# Patient Record
Sex: Female | Born: 1935 | Race: White | Hispanic: No | Marital: Married
Health system: Southern US, Community
[De-identification: ages and names within clinical notes are randomized; demographics above are authoritative.]

---

## 2004-10-14 ENCOUNTER — Emergency Department: Payer: Self-pay | Admitting: Emergency Medicine

## 2004-11-09 ENCOUNTER — Ambulatory Visit: Payer: Self-pay | Admitting: Internal Medicine

## 2004-11-16 ENCOUNTER — Ambulatory Visit: Payer: Self-pay | Admitting: Internal Medicine

## 2004-11-24 ENCOUNTER — Ambulatory Visit: Payer: Self-pay

## 2005-01-09 ENCOUNTER — Ambulatory Visit: Payer: Self-pay | Admitting: Gastroenterology

## 2005-12-12 ENCOUNTER — Other Ambulatory Visit: Payer: Self-pay

## 2005-12-12 ENCOUNTER — Inpatient Hospital Stay: Payer: Self-pay | Admitting: Internal Medicine

## 2006-02-26 ENCOUNTER — Ambulatory Visit: Payer: Self-pay | Admitting: Internal Medicine

## 2007-01-12 ENCOUNTER — Other Ambulatory Visit: Payer: Self-pay

## 2007-01-12 ENCOUNTER — Observation Stay: Payer: Self-pay | Admitting: Internal Medicine

## 2007-04-17 ENCOUNTER — Ambulatory Visit: Payer: Self-pay | Admitting: Internal Medicine

## 2008-04-20 ENCOUNTER — Ambulatory Visit: Payer: Self-pay | Admitting: Internal Medicine

## 2009-04-22 ENCOUNTER — Ambulatory Visit: Payer: Self-pay | Admitting: Internal Medicine

## 2009-07-23 ENCOUNTER — Ambulatory Visit: Payer: Self-pay | Admitting: Internal Medicine

## 2009-08-16 ENCOUNTER — Inpatient Hospital Stay: Payer: Self-pay | Admitting: Internal Medicine

## 2009-08-23 ENCOUNTER — Ambulatory Visit: Payer: Self-pay | Admitting: Internal Medicine

## 2010-03-27 ENCOUNTER — Inpatient Hospital Stay: Payer: Self-pay | Admitting: Internal Medicine

## 2010-04-24 ENCOUNTER — Ambulatory Visit: Payer: Self-pay | Admitting: Internal Medicine

## 2011-04-13 ENCOUNTER — Emergency Department: Payer: Self-pay | Admitting: Emergency Medicine

## 2011-06-03 ENCOUNTER — Inpatient Hospital Stay: Payer: Self-pay | Admitting: Internal Medicine

## 2011-07-20 ENCOUNTER — Ambulatory Visit: Payer: Self-pay | Admitting: Internal Medicine

## 2011-08-14 ENCOUNTER — Encounter: Payer: Self-pay | Admitting: Nurse Practitioner

## 2011-08-14 ENCOUNTER — Encounter: Payer: Self-pay | Admitting: Cardiothoracic Surgery

## 2011-08-24 ENCOUNTER — Encounter: Payer: Self-pay | Admitting: Cardiothoracic Surgery

## 2011-08-24 ENCOUNTER — Encounter: Payer: Self-pay | Admitting: Nurse Practitioner

## 2011-09-21 ENCOUNTER — Encounter: Payer: Self-pay | Admitting: Cardiothoracic Surgery

## 2011-09-21 ENCOUNTER — Encounter: Payer: Self-pay | Admitting: Nurse Practitioner

## 2011-11-01 ENCOUNTER — Encounter: Payer: Self-pay | Admitting: Cardiothoracic Surgery

## 2011-11-01 ENCOUNTER — Encounter: Payer: Self-pay | Admitting: Nurse Practitioner

## 2011-11-21 ENCOUNTER — Encounter: Payer: Self-pay | Admitting: Nurse Practitioner

## 2011-11-21 ENCOUNTER — Encounter: Payer: Self-pay | Admitting: Cardiothoracic Surgery

## 2011-12-20 ENCOUNTER — Emergency Department: Payer: Self-pay | Admitting: Emergency Medicine

## 2011-12-20 LAB — CBC
HCT: 31.5 % — ABNORMAL LOW (ref 35.0–47.0)
MCH: 31.4 pg (ref 26.0–34.0)
MCHC: 33.9 g/dL (ref 32.0–36.0)
MCV: 93 fL (ref 80–100)
Platelet: 239 10*3/uL (ref 150–440)
RBC: 3.4 10*6/uL — ABNORMAL LOW (ref 3.80–5.20)
WBC: 8.1 10*3/uL (ref 3.6–11.0)

## 2011-12-20 LAB — COMPREHENSIVE METABOLIC PANEL
Albumin: 2.9 g/dL — ABNORMAL LOW (ref 3.4–5.0)
Alkaline Phosphatase: 62 U/L (ref 50–136)
Anion Gap: 7 (ref 7–16)
BUN: 15 mg/dL (ref 7–18)
Calcium, Total: 8.7 mg/dL (ref 8.5–10.1)
Chloride: 97 mmol/L — ABNORMAL LOW (ref 98–107)
Co2: 31 mmol/L (ref 21–32)
Creatinine: 1.01 mg/dL (ref 0.60–1.30)
EGFR (African American): 58 — ABNORMAL LOW
Glucose: 91 mg/dL (ref 65–99)
SGOT(AST): 18 U/L (ref 15–37)
SGPT (ALT): 19 U/L
Sodium: 135 mmol/L — ABNORMAL LOW (ref 136–145)

## 2011-12-20 LAB — URINALYSIS, COMPLETE
Bilirubin,UR: NEGATIVE
Blood: NEGATIVE
Glucose,UR: NEGATIVE mg/dL (ref 0–75)
Leukocyte Esterase: NEGATIVE
Nitrite: NEGATIVE
Ph: 8 (ref 4.5–8.0)
Protein: NEGATIVE
RBC,UR: <1 /HPF (ref 0–5)
Specific Gravity: 1.002 (ref 1.003–1.030)
Squamous Epithelial: NONE SEEN
WBC UR: 1 /HPF (ref 0–5)

## 2011-12-20 LAB — CK TOTAL AND CKMB (NOT AT ARMC)
CK, Total: 25 U/L (ref 21–215)
CK-MB: 1.1 ng/mL (ref 0.5–3.6)

## 2011-12-20 LAB — TROPONIN I: Troponin-I: 0.02 ng/mL

## 2011-12-22 ENCOUNTER — Encounter: Payer: Self-pay | Admitting: Nurse Practitioner

## 2011-12-22 ENCOUNTER — Encounter: Payer: Self-pay | Admitting: Cardiothoracic Surgery

## 2012-01-21 ENCOUNTER — Encounter: Payer: Self-pay | Admitting: Nurse Practitioner

## 2012-01-21 ENCOUNTER — Encounter: Payer: Self-pay | Admitting: Cardiothoracic Surgery

## 2012-02-21 ENCOUNTER — Encounter: Payer: Self-pay | Admitting: Nurse Practitioner

## 2012-02-21 ENCOUNTER — Encounter: Payer: Self-pay | Admitting: Cardiothoracic Surgery

## 2012-03-07 ENCOUNTER — Ambulatory Visit: Payer: Self-pay

## 2012-08-17 ENCOUNTER — Inpatient Hospital Stay: Payer: Self-pay

## 2012-08-17 LAB — COMPREHENSIVE METABOLIC PANEL
Albumin: 3 g/dL — ABNORMAL LOW (ref 3.4–5.0)
Alkaline Phosphatase: 77 U/L (ref 50–136)
Anion Gap: 4 — ABNORMAL LOW (ref 7–16)
Calcium, Total: 8.9 mg/dL (ref 8.5–10.1)
Co2: 35 mmol/L — ABNORMAL HIGH (ref 21–32)
Creatinine: 1.13 mg/dL (ref 0.60–1.30)
EGFR (African American): 50 — ABNORMAL LOW
EGFR (Non-African Amer.): 43 — ABNORMAL LOW
Glucose: 111 mg/dL — ABNORMAL HIGH (ref 65–99)
SGPT (ALT): 26 U/L (ref 12–78)
Total Protein: 6.1 g/dL — ABNORMAL LOW (ref 6.4–8.2)

## 2012-08-17 LAB — CK TOTAL AND CKMB (NOT AT ARMC)
CK, Total: 30 U/L (ref 21–215)
CK-MB: 1.7 ng/mL (ref 0.5–3.6)

## 2012-08-17 LAB — CBC WITH DIFFERENTIAL/PLATELET
Basophil #: 0 10*3/uL (ref 0.0–0.1)
Basophil %: 0.4 %
Eosinophil %: 0.3 %
HGB: 10.8 g/dL — ABNORMAL LOW (ref 12.0–16.0)
Lymphocyte #: 1.3 10*3/uL (ref 1.0–3.6)
MCH: 30.6 pg (ref 26.0–34.0)
MCV: 92 fL (ref 80–100)
Monocyte %: 6.5 %
Neutrophil #: 7.6 10*3/uL — ABNORMAL HIGH (ref 1.4–6.5)
Neutrophil %: 79.6 %
Platelet: 249 10*3/uL (ref 150–440)
RDW: 12.9 % (ref 11.5–14.5)
WBC: 9.6 10*3/uL (ref 3.6–11.0)

## 2012-08-17 LAB — PROTIME-INR
INR: 0.9
Prothrombin Time: 12.5 secs (ref 11.5–14.7)

## 2012-08-17 LAB — TROPONIN I: Troponin-I: 0.1 ng/mL — ABNORMAL HIGH

## 2012-08-17 LAB — TSH: Thyroid Stimulating Horm: 1.18 u[IU]/mL

## 2012-08-17 LAB — MAGNESIUM: Magnesium: 2 mg/dL

## 2012-08-18 LAB — CK TOTAL AND CKMB (NOT AT ARMC)
CK, Total: 32 U/L (ref 21–215)
CK-MB: 2 ng/mL (ref 0.5–3.6)

## 2012-08-18 LAB — CBC WITH DIFFERENTIAL/PLATELET
Basophil #: 0.1 10*3/uL (ref 0.0–0.1)
Basophil %: 0.6 %
Eosinophil %: 0.3 %
HCT: 29.1 % — ABNORMAL LOW (ref 35.0–47.0)
HGB: 9.4 g/dL — ABNORMAL LOW (ref 12.0–16.0)
Lymphocyte #: 2.8 10*3/uL (ref 1.0–3.6)
Lymphocyte %: 18.9 %
MCH: 29.6 pg (ref 26.0–34.0)
MCV: 92 fL (ref 80–100)
Monocyte #: 1 x10 3/mm — ABNORMAL HIGH (ref 0.2–0.9)
Monocyte %: 6.8 %
Neutrophil #: 10.7 10*3/uL — ABNORMAL HIGH (ref 1.4–6.5)

## 2012-08-18 LAB — BASIC METABOLIC PANEL
Anion Gap: 5 — ABNORMAL LOW (ref 7–16)
BUN: 18 mg/dL (ref 7–18)
Co2: 34 mmol/L — ABNORMAL HIGH (ref 21–32)
Creatinine: 1.06 mg/dL (ref 0.60–1.30)
EGFR (Non-African Amer.): 47 — ABNORMAL LOW
Osmolality: 273 (ref 275–301)
Potassium: 5 mmol/L (ref 3.5–5.1)
Sodium: 135 mmol/L — ABNORMAL LOW (ref 136–145)

## 2012-08-18 LAB — TROPONIN I: Troponin-I: 0.09 ng/mL — ABNORMAL HIGH

## 2012-08-19 LAB — CBC WITH DIFFERENTIAL/PLATELET
Basophil #: 0 10*3/uL (ref 0.0–0.1)
Basophil %: 0.4 %
Eosinophil %: 1.3 %
Lymphocyte %: 25.9 %
MCH: 30.1 pg (ref 26.0–34.0)
MCHC: 32.3 g/dL (ref 32.0–36.0)
Monocyte #: 0.9 x10 3/mm (ref 0.2–0.9)
Neutrophil %: 60.5 %
RDW: 12.8 % (ref 11.5–14.5)

## 2012-08-20 LAB — CBC WITH DIFFERENTIAL/PLATELET
Basophil #: 0 10*3/uL (ref 0.0–0.1)
Basophil %: 0.4 %
Eosinophil #: 0.1 10*3/uL (ref 0.0–0.7)
HCT: 22.6 % — ABNORMAL LOW (ref 35.0–47.0)
HGB: 7.4 g/dL — ABNORMAL LOW (ref 12.0–16.0)
Lymphocyte %: 25.8 %
MCH: 30.1 pg (ref 26.0–34.0)
MCHC: 32.9 g/dL (ref 32.0–36.0)
Monocyte #: 1.1 x10 3/mm — ABNORMAL HIGH (ref 0.2–0.9)
Neutrophil %: 59.6 %
Platelet: 185 10*3/uL (ref 150–440)
RDW: 12.9 % (ref 11.5–14.5)
WBC: 8.4 10*3/uL (ref 3.6–11.0)

## 2012-09-03 ENCOUNTER — Ambulatory Visit: Payer: Self-pay | Admitting: Vascular Surgery

## 2012-11-20 DEATH — deceased

## 2014-03-31 IMAGING — CR DG CHEST 1V
1 series · 1 of 1 positions shown · non-contrast
Comparison: none

REASON FOR EXAM: fall injury
COMMENTS:

[x chest ap]
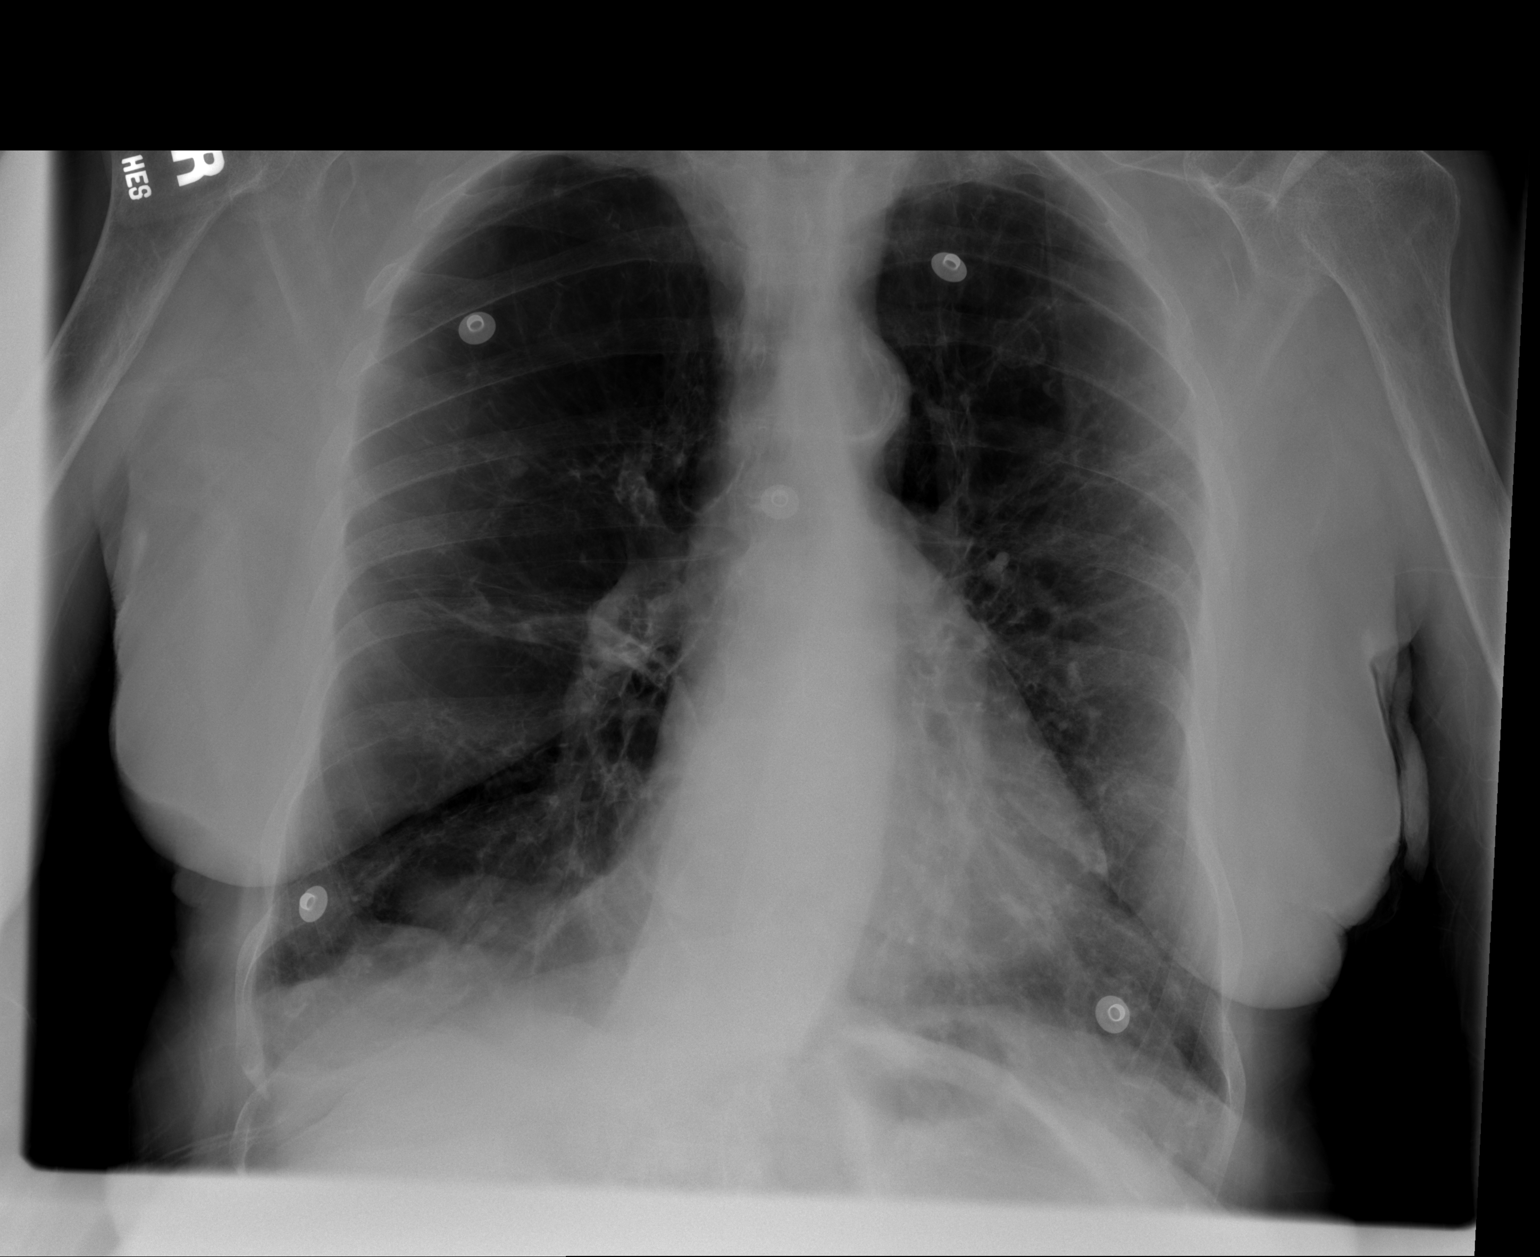

[1 of 1 positions shown; findings below may reference images not displayed]

PROCEDURE:     DXR - DXR CHEST 1 VIEWAP OR PA  - August 17, 2012  [DATE]

RESULT:     Comparison is made to the previous study 20 December, 2011. The
lungs are hyperinflated consistent with COPD. The cardiac silhouette is at
the upper limits of normal. There are extensive bands of density in the
lungs suggestive of fibrosis or atelectasis. No consolidation, mass,
effusion or interstitial edema is evident. Atherosclerotic calcification is
present within the aortic arch. No effusion is demonstrated.
IMPRESSION: COPD with fibrosis. No acute abnormality or significant
change.

[REDACTED]

## 2014-03-31 IMAGING — CR RIGHT TIBIA AND FIBULA - 2 VIEW
1 series · 2 of 2 positions shown · non-contrast
Comparison: none

REASON FOR EXAM: fall / hematoma
COMMENTS:

RESULT:     Images of the right tibia and fibula show no definite fracture,
dislocation or radiopaque foreign body. There is significant atherosclerotic
calcification present. There is diffuse osteopenia.

[Series 1: x tib-fib ap right · 0.14mm/px · 2 of 2 slices shown]
[im 1/2]
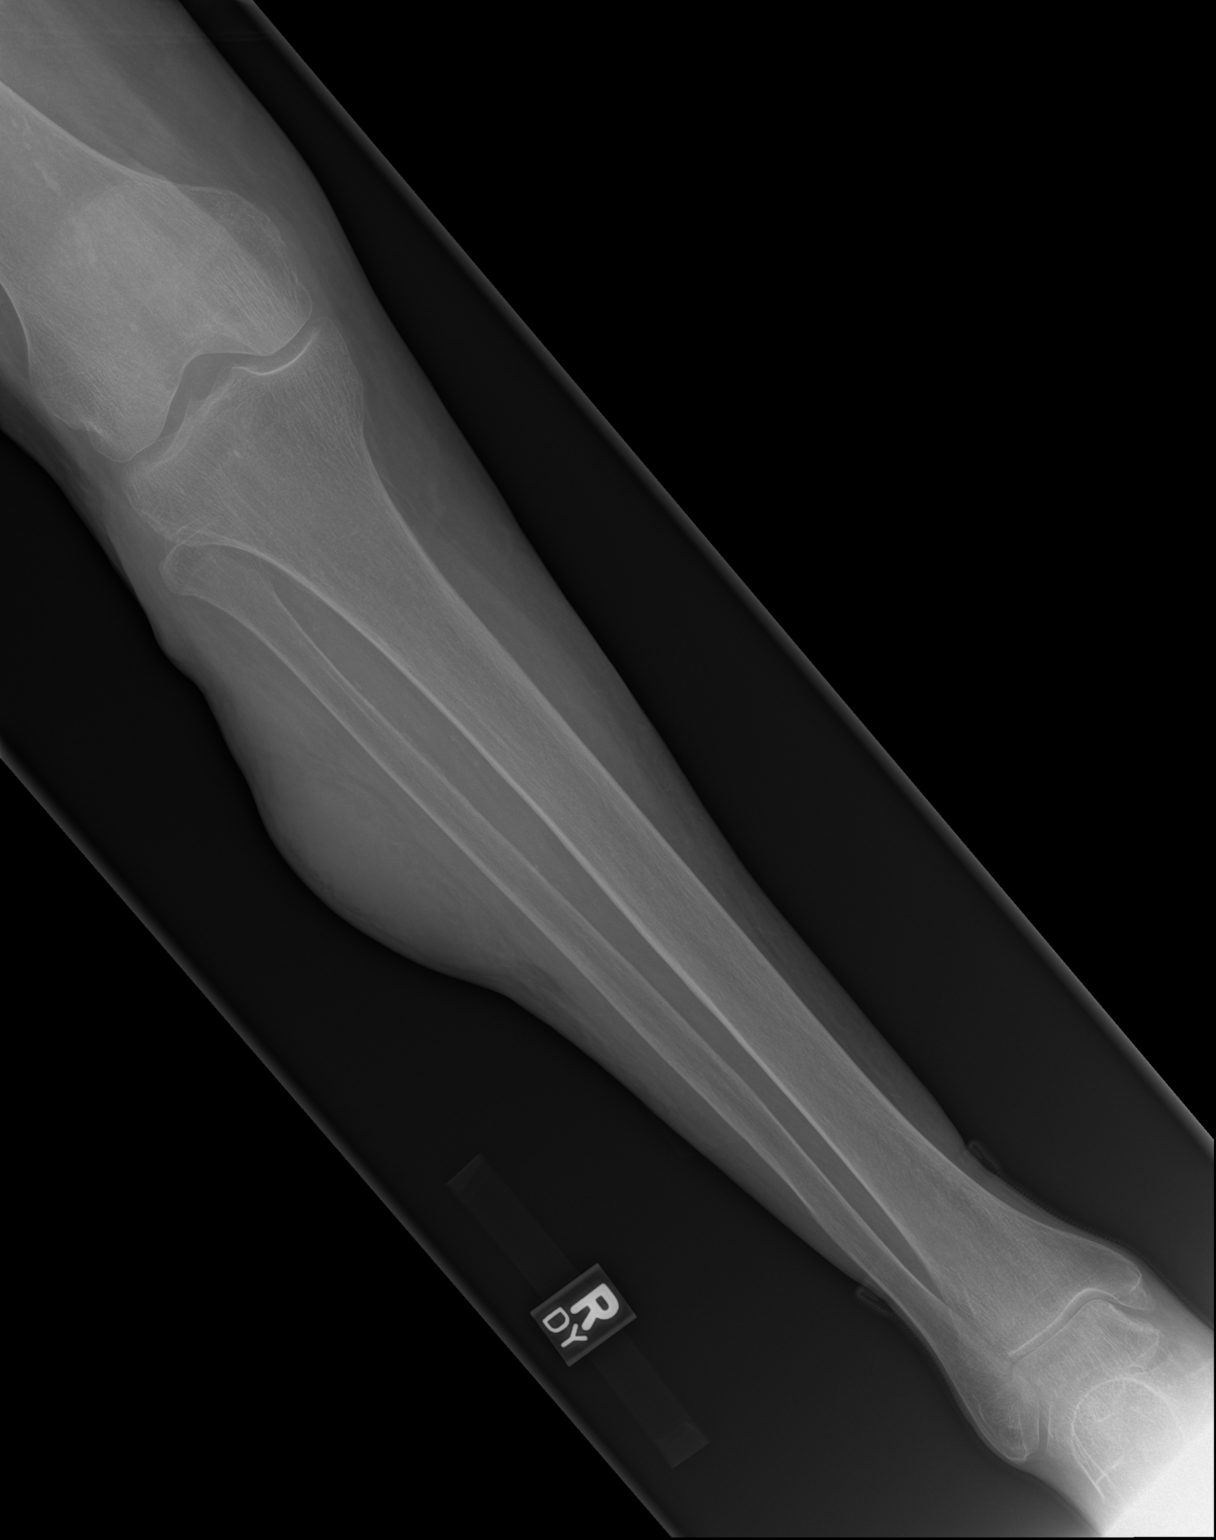
[im 2/2]
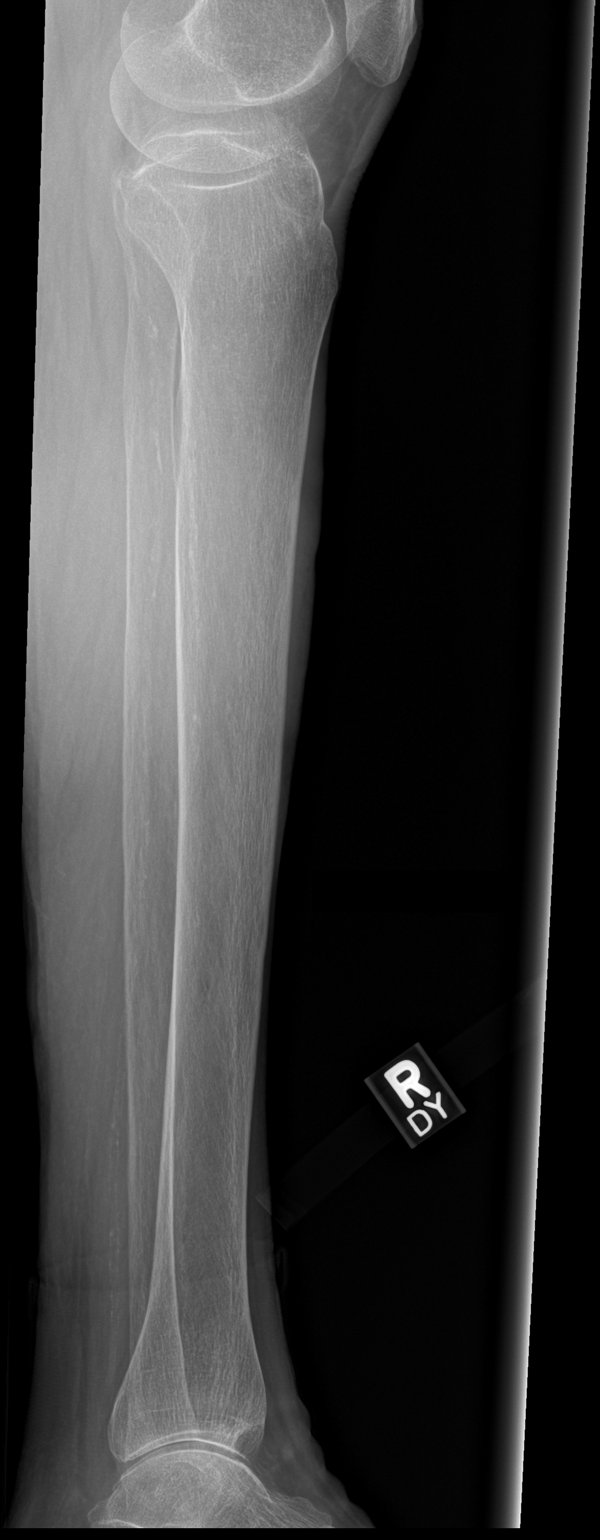

[2 of 2 positions shown; findings below may reference images not displayed]

IMPRESSION: 1. No acute bony abnormality evident.

[REDACTED]

## 2014-11-12 NOTE — Op Note (Signed)
ADDNEDUM  PATIENT NAME:  Mee HivesROBERTSON, Kaelynn M MR#:  [id number] DATE OF BIRTH:  1921-08-02  DATE OF PROCEDURE:  09/03/2012  SPECIMEN: Debrided tissue and contents of hematoma noted. This specimen was described as correct, but it was not sent for pathology.   ____________________________ Renford DillsGregory G. Schnier, MD ggs:aw D: 09/24/2012 13:50:51 ET T: 09/24/2012 14:01:51 ET JOB#: [id number]  cc: Renford DillsGregory G. Schnier, MD, <Dictator> Renford DillsGREGORY G SCHNIER MD ELECTRONICALLY SIGNED 10/21/2012 17:14

## 2014-11-12 NOTE — Consult Note (Signed)
General Aspect 81 year old female with history of CAD PVD hypertension hyperlipidemia and chronic lung disease requiring oxygen that stumbled and fell during the night with significant trauma, hematoma to right lower leg due to history of being on Plavix.Patient also reports she's had intermittent chest discomfort mostly at night for the last month.  She has not had any chest discomfort since she's been in the hospital but she  has had very mildly elevated troponins thought secondary to current illness and not acute coronary syndrome.She does have a left bundle branch block at baseline.  She is maintaining normal sinus rhythm on telemetry.   Physical Exam:   GEN well developed, no acute distress    HEENT pink conjunctivae, hearing intact to voice    RESP normal resp effort  clear BS    CARD Regular rate and rhythm  No murmur    ABD denies tenderness  soft    EXTR significant bruising and swelling of right lower extremity    SKIN skin turgor decreased    NEURO cranial nerves intact, motor/sensory function intact    PSYCH alert, A+O to time, place, person   Review of Systems:   Subjective/Chief Complaint Fall with subsequent trauma to right lower leg    Skin: history of bruising    Cardiovascular: Chest pain or discomfort    Musculoskeletal: hematoma bruising swelling right leg    Hematologic: Ease of bruising    Review of Systems: All other systems were reviewed and found to be negative     ED ECG, Stat-Interpreted by-.ordering physician-Reason for ecg -fall, 17-Aug-2012, Active, Standard   Echo Doppler, Interpreted by: Serafina Royals MD  CARDIAC Reason for Test: Respiratory Failure, 17-Aug-2012, Completed, Standard  Lab Results: Routine Chem:  27-Jan-14 02:35    Result Comment TROPONIN - RESULTS VERIFIED BY REPEAT TESTING.  - ELEVATED TROPONIN PREVIOUSLY CALLED AT  - 1917 08/17/12.PMH  Result(s) reported on 18 Aug 2012 at 03:26AM.   Glucose, Serum  113   BUN 18    Creatinine (comp) 1.06   Sodium, Serum  135   Potassium, Serum 5.0   Chloride, Serum  96   CO2, Serum  34   Calcium (Total), Serum 8.8   Anion Gap  5   Osmolality (calc) 273   eGFR (African American)  54   eGFR (Non-African American)  47 (eGFR values <26m/min/1.73 m2 may be an indication of chronic kidney disease (CKD). Calculated eGFR is useful in patients with stable renal function. The eGFR calculation will not be reliable in acutely ill patients when serum creatinine is changing rapidly. It is not useful in  patients on dialysis. The eGFR calculation may not be applicable to patients at the low and high extremes of body sizes, pregnant women, and vegetarians.)  Cardiac:  27-Jan-14 02:35    CK, Total 32   CPK-MB, Serum 2.0 (Result(s) reported on 18 Aug 2012 at 03:22AM.)   Troponin I  0.09 (0.00-0.05 0.05 ng/mL or less: NEGATIVE  Repeat testing in 3-6 hrs  if clinically indicated. >0.05 ng/mL: POTENTIAL  MYOCARDIAL INJURY. Repeat  testing in 3-6 hrs if  clinically indicated. NOTE: An increase or decrease  of 30% or more on serial  testing suggests a  clinically important change)  Routine Hem:  27-Jan-14 02:35    WBC (CBC)  14.6   RBC (CBC)  3.17   Hemoglobin (CBC)  9.4   Hematocrit (CBC)  29.1   Platelet Count (CBC) 259   MCV 92  MCH 29.6   MCHC 32.2   RDW 12.9   Neutrophil % 73.4   Lymphocyte % 18.9   Monocyte % 6.8   Eosinophil % 0.3   Basophil % 0.6   Neutrophil #  10.7   Lymphocyte # 2.8   Monocyte #  1.0   Eosinophil # 0.1   Basophil # 0.1 (Result(s) reported on 18 Aug 2012 at [facility].)   Radiology Results: XRay:    26-Jan-14 18:36, Chest 1 View AP or PA   Chest 1 View AP or PA    REASON FOR EXAM:    fall injury  COMMENTS:       PROCEDURE: DXR - DXR CHEST 1 VIEWAP OR PA  - Aug 17 2012  6:36PM     RESULT: Comparison is made to the previous study of 20 Dec 2011. The   lungs are hyperinflated consistent with COPD. The cardiac silhouette is    at the upper limits of normal. There are extensive bands of density in   the lungs suggestive of fibrosis or atelectasis. No consolidation, mass,   effusion or interstitial edema is evident. Atherosclerotic calcification   is present within the aortic arch. No effusion is demonstrated.    IMPRESSION:  COPD with fibrosis. No acute abnormality or significant   change.  Thank you for the opportunity to contribute to the care of your patient.     Dictation Site: 1        Verified By: Sundra Aland, M.D., MD    No Known Allergies:   Vital Signs/Nurse's Notes: **Vital Signs.:   27-Jan-14 13:11   Vital Signs Type Routine   Temperature Temperature (F) 98.6   Pulse Pulse 68   Respirations Respirations 16   Systolic BP Systolic BP 888   Diastolic BP (mmHg) Diastolic BP (mmHg) 74   Mean BP 92   Pulse Ox % Pulse Ox % 96   Pulse Ox Activity Level  At rest   Oxygen Delivery 3L     Impression 81 year old female with fall and trauma to right leg with history of CAD PVD hypertension hyperlipidemia chronic lung disease with some complaints of intermittent chest pain over the last month crushable etiology and troponin positive.    Plan 1.  Stop Plavix due to fall risk and trauma. 2.  Elevated troponin thought not to be acute coronary syndrome,  possibly due to current illness. 3.  If the patient  complains of chest pain in the hospital will consider obtaining a stress test to assess for ischemia.  Patient was seen in collaboration with Dr. Nehemiah Massed   Electronic Signatures: Roderic Palau (NP)  (Signed 27-Jan-14 15:29)  Authored: General Aspect/Present Illness, History and Physical Exam, Review of System, Orders, Labs, Radiology, Allergies, Vital Signs/Nurse's Notes, Impression/Plan   Last Updated: 27-Jan-14 15:29 by Roderic Palau (NP)

## 2014-11-12 NOTE — Discharge Summary (Signed)
PATIENT NAME:  Hannah Davenport, Hannah Davenport MR#:  [id number] DATE OF BIRTH:  [birth year]  DATE OF ADMISSION:  08/17/2012 DATE OF DISCHARGE:  08/20/2012  PRIMARY CARE PHYSICIAN: Clydie Braunavid Gorman Safi, MD  DISCHARGE DIAGNOSES:  1. Fall with a large lower extremity hematoma.  2. Acute blood loss anemia.  3. Coronary artery disease with positive troponins.  4. Chronic obstructive pulmonary disease, on home O2.  5. Bladder cancer, on hospice.   HISTORY OF PRESENT ILLNESS: Please see admission history and physical from 08/17/2012. Briefly, this is an 81 year old woman on home O2 as well as on hospice for bladder cancer who stumbled and fell and hurt her right lower extremity. She was brought to the ED and found to be malignantly hypertensive. Her troponins were positive and she was admitted for further evaluation.  HOSPITAL COURSE BY ISSUE:  1. Positive troponin. She was seen by Dr. Gwen PoundsKowalski. Her troponins stabilized. She had no chest pain or other symptoms. Blood pressure was normalized. It was felt that this was likely supply/demand ischemia.  2. Hypertension. This was much better controlled with control of pain and continuation of her outpatient medications.  3. Lower extremity hematoma. This was quite large and she was seen by orthopedics who recommended ice and elevation. Her Plavix was also discontinued with the agreement of cardiology. She will need to continue to elevate this and provide local care at home. If worsens she may need referral to [facility].  4. Anemia. Her hemoglobin decreased to 7.4 from 10.8, on the day of admission. Some element of this may have been dilutional. It is most likely from her large bleed in her sub-Q tissue and her hematoma. This remained stable and will be repeated as an outpatient.  5. COPD. This remained stable and she remained on her home oxygen level.  6. Debility. The patient was seen by physical therapy who recommended home treatments in order to maintain  functionality.  7. Bladder cancer. The patient has an ostomy. She remains under the care of hospice. We discussed with hospice liaison. They will continue to provide home hospice care for the patient.   DISCHARGE DIET: Regular.   DISCHARGE FOLLOWUP: The patient will follow up with Dr. Sampson GoonFitzgerald in 1 to 2 weeks. At that point, will need to reassess her hemoglobin and reassess the size of the hematoma and healing of it. We will also have to reassess her cardiac symptoms after stopping her Plavix.   DISCHARGE MEDICATIONS: 1. Protonix 40 mg once a day.  2. Ocuvite.  3. Calcium.  4. Metoprolol 50 mg twice a day.  5. Advair 1 puff twice a day.  6. Spiriva one a day.  7. Synthroid 50 mcg once a day.  8. Lasix 20 mg once a day.  9. Senna 8.6 mg once a day.  10. Mirtazapine 15 mg once a day.  11. Prednisone 5 mg once a day.  12. Ativan 1 mg 3 times a day.  13. Isosorbide 60 mg once a day.  14. Tylenol 325 mg once a day.   NOTE: She will stop taking the Plavix.  TIME SPENT: 40 minutes. ____________________________ Stann Mainlandavid P. Sampson GoonFitzgerald, MD dpf:sb D: 08/20/2012 14:30:31 ET T: 08/20/2012 14:41:23 ET JOB#: [id number]  cc: Stann Mainlandavid P. Sampson GoonFitzgerald, MD, <Dictator> Encarnacion Bole Sampson GoonFITZGERALD MD ELECTRONICALLY SIGNED 08/20/2012 15:40

## 2014-11-12 NOTE — Op Note (Signed)
PATIENT NAME:  Mee HivesROBERTSON, Alaria M MR#:  [id number] DATE OF BIRTH:  1922/01/17  DATE OF PROCEDURE:  09/03/2012  PREOPERATIVE DIAGNOSIS: Right leg hematoma with necrotic tissue.   POSTOPERATIVE DIAGNOSIS: Right leg hematoma with necrotic tissue.   PROCEDURE PERFORMED: Excisional debridement of right leg hematoma with application of a VAC wound dressing.    SURGEON: Renford DillsGregory G Amyiah Gaba, M.D.   ANESTHESIA: General by LMA.   FLUIDS: Per anesthesia record.   ESTIMATED BLOOD LOSS: Minimal.   SPECIMEN: Debrided tissue and hematoma contents to pathology for permanent section.   INDICATIONS: The patient is an 81 year old woman, who is maintained on Coumadin and fell banging her right leg and developed a very large hematoma, which is now draining and demonstrates extensive necrosis of the skin overlying the hematoma. Risks and benefits for debridement were reviewed. All questions answered. The patient has agreed to proceed.   DESCRIPTION OF PROCEDURE: The patient is taken to the operating room and placed in the supine position. After adequate general anesthesia was induced and appropriate invasive monitors are placed, she is positioned supine and her right leg is prepped and draped in a sterile fashion. Appropriate timeout is called.   Using a 15 blade scalpel as well as curved Mayos, the skin surrounding the hematoma is then incised and removed. The debridement is carried down to the fascia exposing the fascia of the anterior compartment. Subcutaneous tissues are also debrided. This is to a depth of several millimeters. The overall area is approximately 20 cm x 9 cm and oval in appearance. Hemostasis is obtained with Bovie cautery. After the entire amount of hematoma as well as necrotic tissue and necrotic skin has been debrided, a VAC wound dressing is placed. The patient tolerated the procedure well and there were no immediate complications.   ____________________________ Renford DillsGregory G. Keyan Folson,  MD ggs:aw D: 09/08/2012 23:19:01 ET T: 09/09/2012 10:42:59 ET JOB#: [id number]  cc: Renford DillsGregory G. Dhani Dannemiller, MD, <Dictator> Renford DillsGREGORY G Abdi Husak MD ELECTRONICALLY SIGNED 09/16/2012 11:23

## 2014-11-12 NOTE — H&P (Signed)
PATIENT NAME:  Hannah Davenport, Hannah Davenport MR#:  [id number] DATE OF BIRTH:  1922-05-24  DATE OF ADMISSION:  08/17/2012  REFERRING PHYSICIAN: Dr. Enedina FinnerGoli.   FAMILY PHYSICIAN: Dr. Sampson GoonFitzgerald.   REASON FOR ADMISSION: Right lower extremity trauma associated with accelerated hypertension and abnormal troponin.   HISTORY OF PRESENT ILLNESS: The patient is an 81 year old female who has a history of chronic respiratory failure due to COPD, on oxygen. She has been followed by hospice in the past. She is a DO NOT RESUSCITATE. Got up today and stumbled, injuring her right lower extremity. She was brought to the emergency room where she was found to be malignantly hypertensive. Troponin was drawn and was mildly elevated. She denies chest pain. She has chronic shortness of breath. Her EKG revealed left bundle branch block. She is now admitted for further evaluation.   PAST MEDICAL HISTORY: 1.  Single vessel coronary artery disease being medically managed.  2.  Peripheral vascular disease with carotid stenosis.  3.  PSVT.  4.  Chronic respiratory failure, on oxygen.  5.  COPD.   6.  Hypothyroidism.  7.  Benign hypertension. 8.  Hyperlipidemia.  9.  Bladder cancer, status post surgical resection with urostomy.  10.  Status post cholecystectomy.  11.  Status post hysterectomy.   MEDICATIONS: 1.  Synthroid 50 mcg p.o. daily.  2.  Spiriva 1 capsule inhaled daily.  3.  Protonix 40 mg p.o. daily.  4.  Plavix 75 mg p.o. daily.  5.  Prednisone 5 mg p.o. daily.  6.  Remeron 15 mg p.o. at bedtime.  7.  Toprol-XL 50 mg p.o. b.i.d.  8.  Ativan 1 mg p.o. t.i.d. p.r.n. anxiety.  9.  Lasix 20 mg p.o. daily.  10.  Advair 250/50 one puff b.i.d.  11.  Caltrate D 1 p.o. b.i.d.   ALLERGIES: No known drug allergies.   SOCIAL HISTORY: The patient has a remote history of tobacco abuse. None recently. No history of alcohol abuse.   FAMILY HISTORY: Positive for hypertension and coronary artery disease and stroke. Negative for  colon or breast cancer.   REVIEW OF SYSTEMS:  CONSTITUTIONAL: No fever or change in weight.  EYES: No blurred or double vision. No glaucoma.  ENT: No tinnitus or hearing loss. No nasal discharge or bleeding. No difficulty swallowing.  RESPIRATORY: No cough or wheezing. Denies hemoptysis. No painful respiration.  CARDIOVASCULAR: No chest pain or orthopnea. No palpitations or syncope.  GASTROINTESTINAL: No nausea, vomiting, or diarrhea. No abdominal pain. No change in bowel habits.  GENITOURINARY: No dysuria or hematuria. No incontinence.  ENDOCRINE: No polyuria or polydipsia. No heat or cold intolerance.  HEMATOLOGIC: The patient denies anemia, easy bruising, or bleeding.  LYMPHATIC: No swollen glands.  MUSCULOSKELETAL: The patient has pain in her right lower extremity, but denies pain in her neck, back, shoulders, knees, or hips. No gout.  NEUROLOGIC: No numbness, although she does have generalized weakness. Denies migraines, stroke or seizures.  PSYCHIATRIC: The patient denies anxiety, insomnia or depression.   PHYSICAL EXAMINATION: GENERAL: The patient is chronically ill appearing, in no acute distress.  VITAL SIGNS: Vital signs are currently remarkable for a blood pressure of 206/102 with a heart rate of 92 and a respiratory rate of 24. She is afebrile. Sat is 97% on 4 liters.   HEENT: Normocephalic, atraumatic. Pupils equally round and reactive to light and accommodation. Extraocular movements are intact. Sclerae not icteric. Conjunctivae are clear. : Oropharynx is clear.  NECK: Supple without JVD. Bilateral carotid  bruits are noted. No adenopathy or thyromegaly is present.  LUNGS: Decreased breath sounds with basilar rhonchi. No wheezes or rales. No dullness. Respiratory effort is normal.  CARDIAC: Regular rate and rhythm with normal S1, S2. No significant rubs, murmurs, or gallops. PMI is nondisplaced. Chest wall is nontender.  ABDOMEN: Soft, nontender with normoactive bowel sounds. No  organomegaly or masses were appreciated. No hernias or bruits were noted.  EXTREMITIES: A large hematoma to the right lower extremity with tenderness and surrounding erythema. Distal pulses are 1+ bilaterally.  SKIN: Warm and dry without rash.  NEUROLOGIC: Cranial nerves II through XII grossly intact. Deep tendon reflexes were symmetric. Motor and sensory exam is nonfocal.  PSYCHIATRIC: The patient was alert and oriented to person, place, and time. She was cooperative and used good judgment.   LABORATORY DATA: EKG revealed sinus rhythm at 90 beats per minute with a left bundle branch block.   Chest x-ray showed no acute infiltrate or edema.   Troponin was 0.10. TSH was normal at 1.18. Magnesium 2.0. INR was 0.9. Her glucose was 111 with a BUN of 18 and creatinine of 1.13 with a sodium of 133 and a GFR of 43. Her white count was 9.6 with a hemoglobin of 10.8.   Plain films of the right lower extremity were unremarkable.   ASSESSMENT: 1.  Accelerated hypertension.  2.  Elevated troponin.  3.  Anemia of chronic disease.  4.  Hyponatremia.  5.  Stage II chronic kidney disease.  6.  Right lower extremity trauma with hematoma.  7.  Single vessel coronary artery disease being medically managed.  8.  Chronic respiratory failure, on oxygen.  9.  Chronic obstructive pulmonary disease.  10.  Hypothyroidism.   PLAN: The patient will be admitted to telemetry as a NO CODE BLUE, DO NOT RESUSCITATE according to her wishes. She will be maintained on oxygen and her usual pulmonary regimen.  We will add topical nitrates at this time because of her accelerated hypertension and elevated troponin. We will continue Plavix. We will hold off on any other anticoagulation at this time because of her severe hematoma and right lower extremity trauma. We will follow serial cardiac enzymes and obtain an echocardiogram. We will consult Dr. Gwen Pounds, her cardiologist, in the morning for further evaluation of her elevated  troponin. We will apply ice to the right lower extremity and consult orthopedics in the morning because of her severe pain and trauma. Follow up routine labs in the morning. Further treatment and evaluation will depend upon the patient's progress.   TOTAL TIME SPENT: 50 minutes.    ____________________________ Duane Lope Judithann Sheen, MD jds:cc D: 08/17/2012 19:58:45 ET T: 08/17/2012 20:07:56 ET JOB#: [id number]  cc: Duane Lope. Judithann Sheen, MD, <Dictator> Dr. Johnney Killian MD ELECTRONICALLY SIGNED 08/18/2012 8:08

## 2014-11-12 NOTE — Consult Note (Signed)
Brief Consult Note: Diagnosis: Right leg hematoma.   Consult note dictated.   Comments: Patient fell from standing directly onto right side.  Complains of pain in her right leg.  She is on clopidogrel at home and has had a history of previous hematomas after falls. She explains that recently she was treated by the wound service for a hematoma on the same leg.  RLE: Large area of deep ecchymosis on right leg, worse laterally.  Large palpable hematoma lateral leg.  Tender to palpation.  Tibia with mild TTP.  Full active motion of hip, knee and ankle with preserved strength. No open lesions.  Plan:  Patient may be weight bearing as tolerated.  Ice to leg.  Elevate.  Do not recommend aspiration at this time because there may be high risk of causing worsdening of bleed. Hope for spontaneous resolution - may be difficult if still on blood thinner.  If possible to safely hold clopidogrel, may help promote quicker resolution.  Electronic Signatures: Murlean Harkamasunder, Ryley Teater (MD)  (Signed 27-Jan-14 17:53)  Authored: Brief Consult Note   Last Updated: 27-Jan-14 17:53 by Murlean Harkamasunder, Shakela Donati (MD)
